# Patient Record
Sex: Male | Born: 1975 | Hispanic: No | Marital: Single | Smoking: Current every day smoker
Health system: Southern US, Community
[De-identification: ages and names within clinical notes are randomized; demographics above are authoritative.]

## PROBLEM LIST (undated history)

## (undated) DIAGNOSIS — B351 Tinea unguium: Secondary | ICD-10-CM

## (undated) HISTORY — DX: Tinea unguium: B35.1

---

## 2000-08-25 ENCOUNTER — Encounter: Admission: RE | Admit: 2000-08-25 | Discharge: 2000-08-25 | Payer: Self-pay | Admitting: Family Medicine

## 2000-08-25 ENCOUNTER — Encounter: Payer: Self-pay | Admitting: Family Medicine

## 2004-12-02 ENCOUNTER — Emergency Department (HOSPITAL_COMMUNITY): Admission: EM | Admit: 2004-12-02 | Discharge: 2004-12-02 | Payer: Self-pay | Admitting: Emergency Medicine

## 2006-08-08 IMAGING — CR DG ABDOMEN ACUTE W/ 1V CHEST
3 series · 3 of 3 positions shown · non-contrast
Comparison: No comparison films available.

CLINICAL DATA: Abdominal pain.
 THREE-VIEW ACUTE ABDOMEN SERIES WITH CHEST:

[w chest pa]
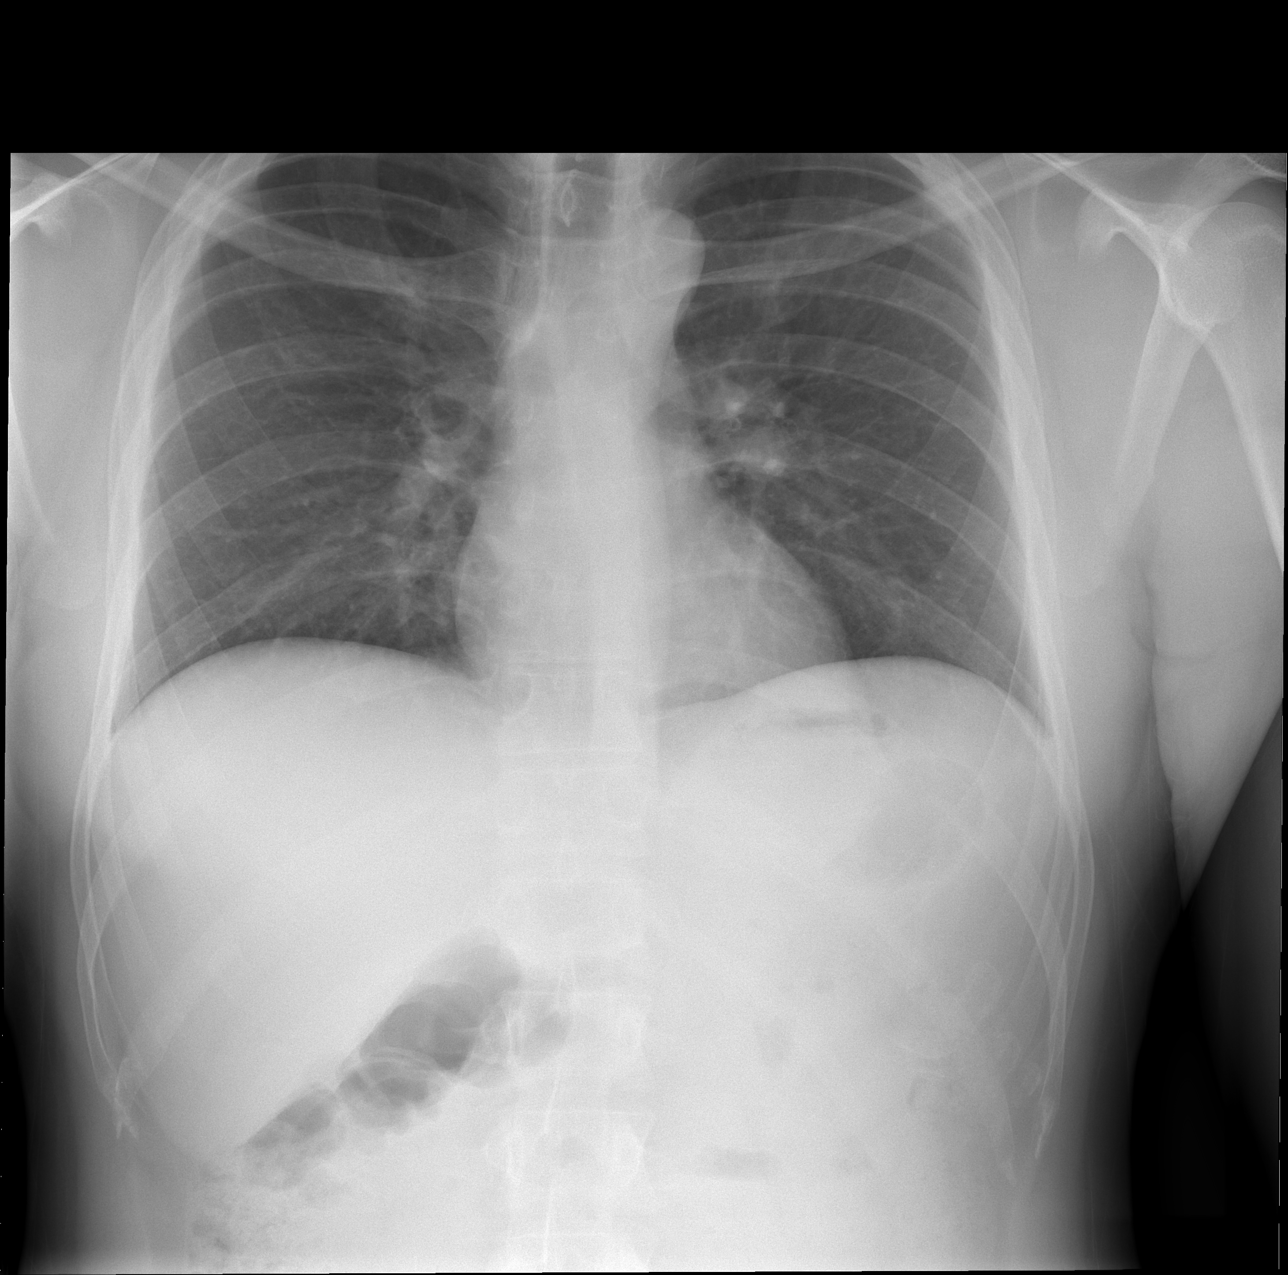

[w abdomen upright]
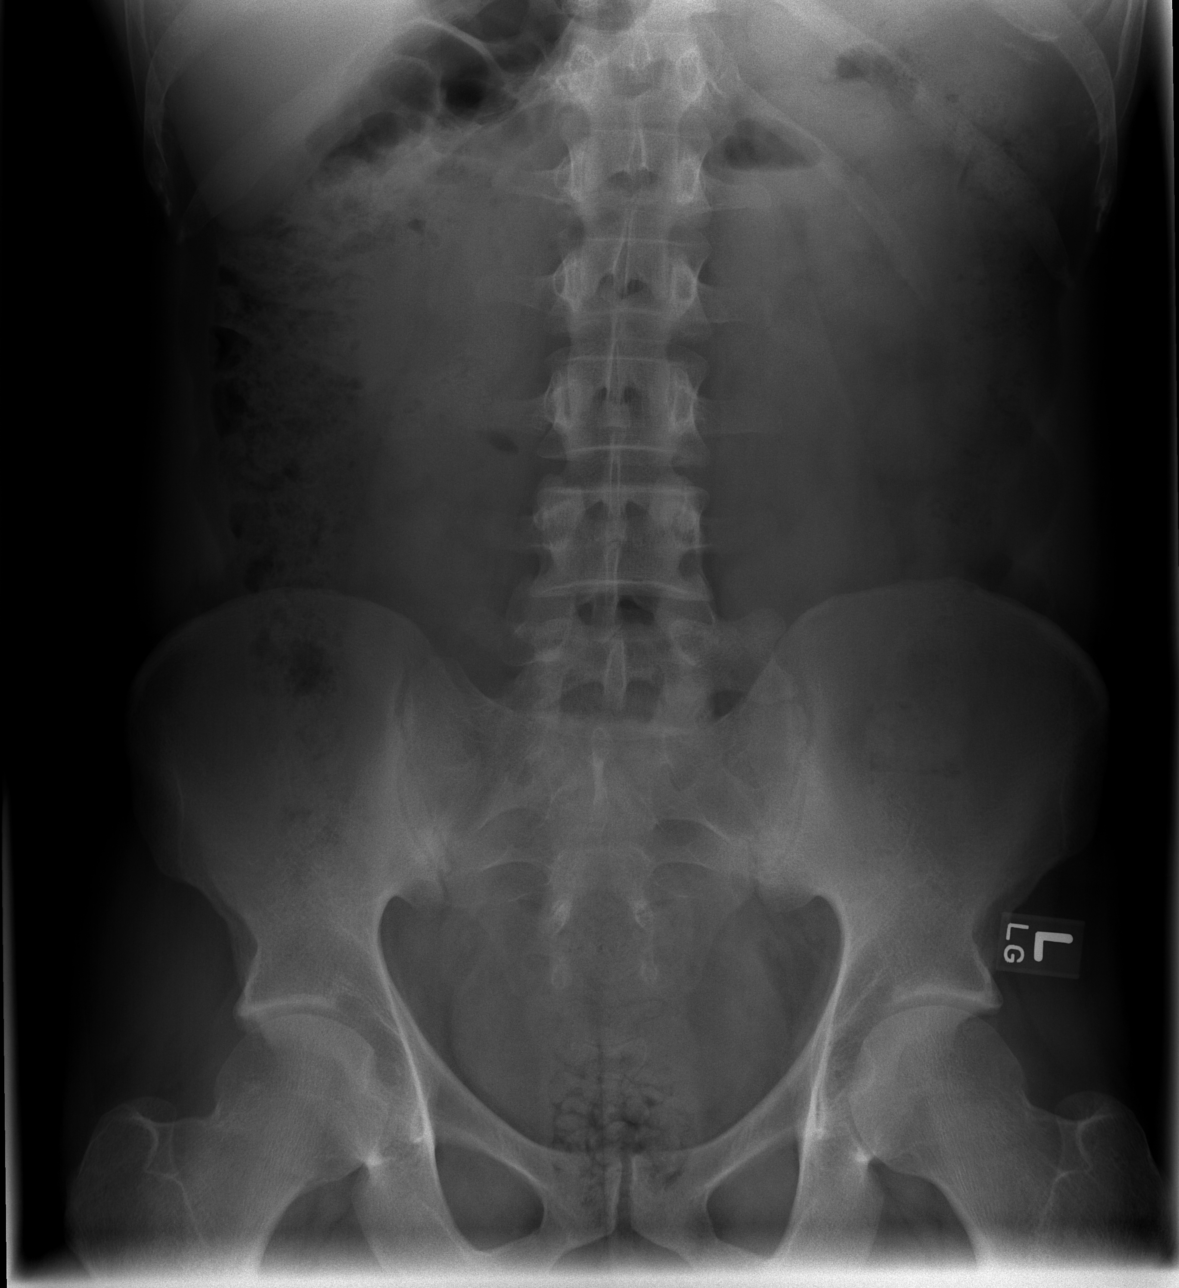

[t abdomen supine]
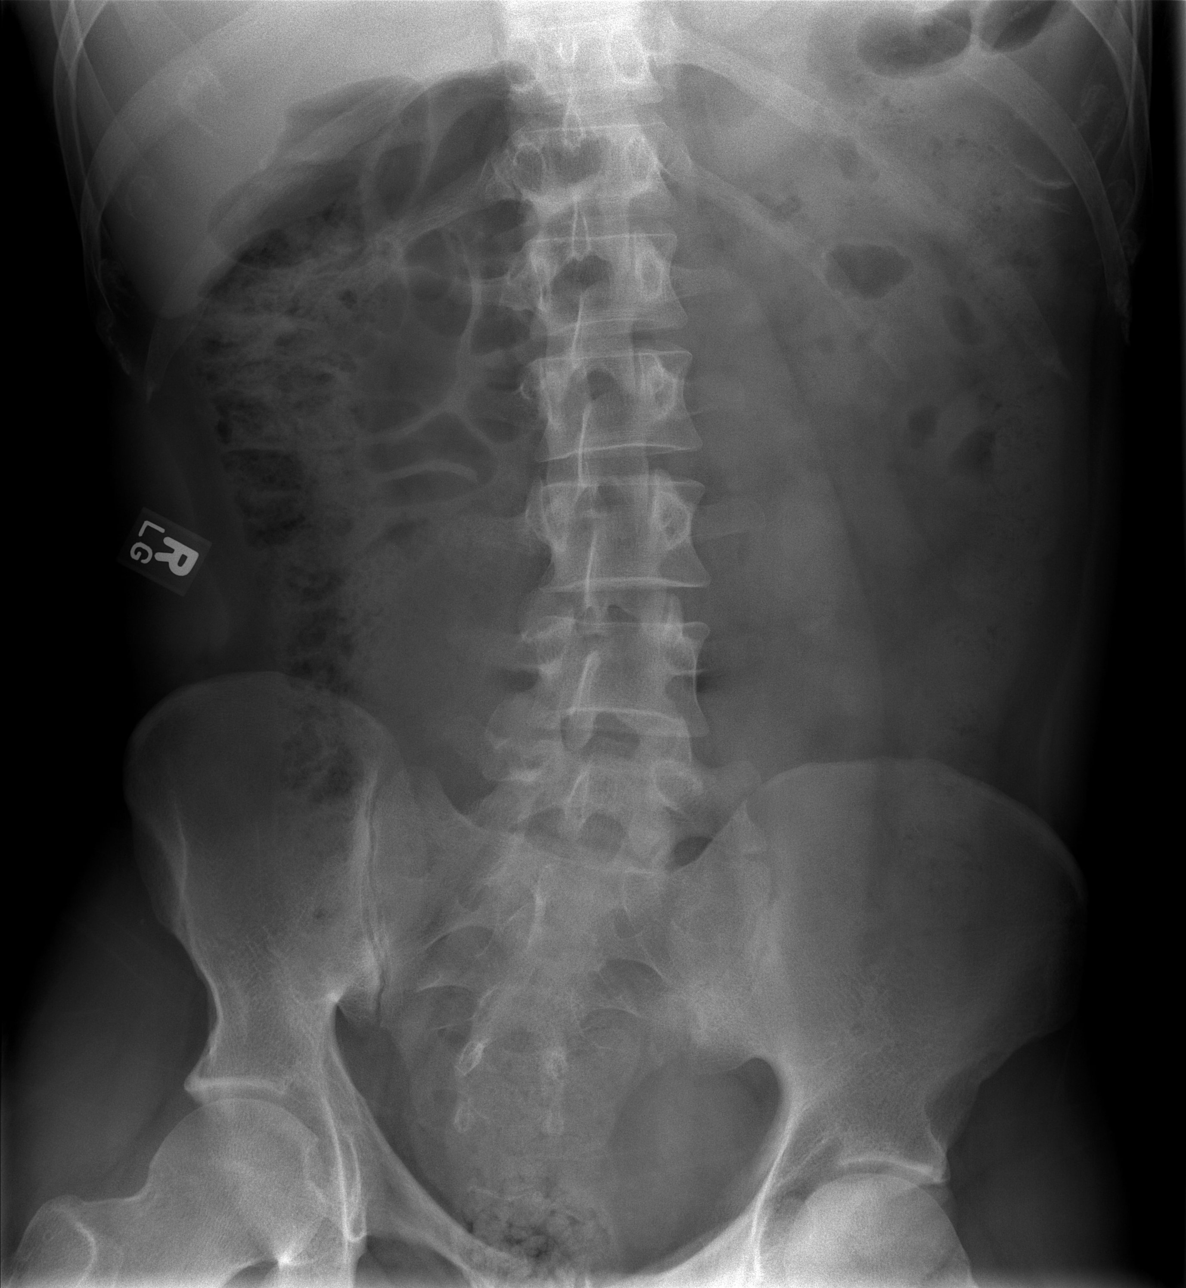

[3 of 3 positions shown; findings below may reference images not displayed]

FINDINGS: The cardiomediastinal silhouette is unremarkable.  Lungs are clear.  Bowel gas pattern is unremarkable.  No suspicious calcifications.  The bony structures are unremarkable.
IMPRESSION: No acute abnormality.

## 2007-04-21 ENCOUNTER — Ambulatory Visit: Payer: Self-pay | Admitting: Family Medicine

## 2007-04-29 ENCOUNTER — Ambulatory Visit: Payer: Self-pay | Admitting: Family Medicine

## 2008-05-30 ENCOUNTER — Ambulatory Visit: Payer: Self-pay | Admitting: Family Medicine

## 2009-03-02 ENCOUNTER — Ambulatory Visit: Payer: Self-pay | Admitting: Family Medicine

## 2009-03-06 ENCOUNTER — Ambulatory Visit: Payer: Self-pay | Admitting: Family Medicine

## 2011-02-07 ENCOUNTER — Emergency Department (HOSPITAL_COMMUNITY)
Admission: EM | Admit: 2011-02-07 | Discharge: 2011-02-07 | Disposition: A | Discharge status: Home / Self Care | Payer: No Typology Code available for payment source | Attending: Emergency Medicine | Admitting: Emergency Medicine

## 2011-02-07 DIAGNOSIS — T148XXA Other injury of unspecified body region, initial encounter: Secondary | ICD-10-CM | POA: Insufficient documentation

## 2011-02-07 DIAGNOSIS — S139XXA Sprain of joints and ligaments of unspecified parts of neck, initial encounter: Secondary | ICD-10-CM | POA: Insufficient documentation

## 2011-02-07 DIAGNOSIS — M542 Cervicalgia: Secondary | ICD-10-CM | POA: Insufficient documentation

## 2011-02-07 DIAGNOSIS — R109 Unspecified abdominal pain: Secondary | ICD-10-CM | POA: Insufficient documentation

## 2014-02-07 ENCOUNTER — Encounter (HOSPITAL_COMMUNITY): Payer: Self-pay | Admitting: Emergency Medicine

## 2014-02-07 ENCOUNTER — Emergency Department (HOSPITAL_COMMUNITY)
Admission: EM | Admit: 2014-02-07 | Discharge: 2014-02-07 | Discharge status: Against Medical Advice | Payer: Managed Care, Other (non HMO) | Attending: Emergency Medicine | Admitting: Emergency Medicine

## 2014-02-07 DIAGNOSIS — R6889 Other general symptoms and signs: Secondary | ICD-10-CM | POA: Insufficient documentation

## 2014-02-07 DIAGNOSIS — F17200 Nicotine dependence, unspecified, uncomplicated: Secondary | ICD-10-CM | POA: Insufficient documentation

## 2014-02-07 DIAGNOSIS — R062 Wheezing: Secondary | ICD-10-CM | POA: Insufficient documentation

## 2014-02-07 NOTE — ED Notes (Addendum)
According to PTAR, the patient was eating asparagus and choked on a piece of it.  The paramedics that were on the scene advised PTAR that he was wheezing.  PTAR said the patient had blown his nose and part of the Asparagus came out.  The patient says he was wheezing at first, but now he doesn't feel like anything is in his airway.  He is able to clear his throat and cough.

## 2014-02-07 NOTE — ED Notes (Signed)
Pt states he feels much better and would like to go home. Pt was encouraged to stay and be evaluated by EDP. Pt stated he can breath fine and would rather leave than wait and doesn't feel like he needs to be seen by EDP . Pt A&Ox4, respirations equal and unlabored, skin warm and dry.

## 2014-02-07 NOTE — ED Notes (Signed)
According to PTAR, the patient was eating asparagus and choked on a piece of it.  The paramedics that were on the scene advised PTAR that he was wheezing.  PTAR said the patient had blown his nose and part of the Asparagus came out.  En route, PTAR put the patient on 3L of oxygen and transported the patient here.  He was able to cough and clear his throat.

## 2014-03-14 ENCOUNTER — Ambulatory Visit (INDEPENDENT_AMBULATORY_CARE_PROVIDER_SITE_OTHER): Payer: Managed Care, Other (non HMO) | Admitting: Family Medicine

## 2014-03-14 ENCOUNTER — Encounter: Payer: Self-pay | Admitting: Family Medicine

## 2014-03-14 VITALS — BP 124/98 | Wt 212.0 lb

## 2014-03-14 DIAGNOSIS — K122 Cellulitis and abscess of mouth: Secondary | ICD-10-CM

## 2014-03-14 DIAGNOSIS — J301 Allergic rhinitis due to pollen: Secondary | ICD-10-CM

## 2014-03-14 NOTE — Progress Notes (Signed)
   Subjective:    Patient ID: Mark Diaz, male    DOB: 03-06-1976, 38 y.o.   MRN: [id number]  HPI He developed his usual allergy symptoms a few days ago and started on antihistamine. He usually has trouble in the spring and fall as well as weather changes. He woke up this morning feeling as if he had something stuck in the back of his throat which made it difficult to swallow.   Review of Systems     Objective:   Physical Exam alert and in no distress. Tympanic membranes and canals are normal. Throat shows a slightly erythematous swollen uvula. Tonsils are normal. Neck is supple without adenopathy or thyromegaly. Cardiac exam shows a regular sinus rhythm without murmurs or gallops. Lungs are clear to auscultation.        Assessment & Plan:  Allergic rhinitis due to pollen  Uvulitis  I explained that the swollen uvula is nothing to worry about but certainly disconcerting. Recommended ice chips and continue on antihistamine.

## 2014-03-14 NOTE — Patient Instructions (Signed)
Ice chips and cold drinks as well as an antihistamine are the secrets

## 2014-04-04 ENCOUNTER — Encounter: Payer: Managed Care, Other (non HMO) | Admitting: Family Medicine

## 2014-05-09 ENCOUNTER — Ambulatory Visit (INDEPENDENT_AMBULATORY_CARE_PROVIDER_SITE_OTHER): Payer: Managed Care, Other (non HMO) | Admitting: Family Medicine

## 2014-05-09 ENCOUNTER — Encounter: Payer: Self-pay | Admitting: Family Medicine

## 2014-05-09 VITALS — BP 120/80 | HR 78 | Ht 70.0 in | Wt 207.0 lb

## 2014-05-09 DIAGNOSIS — Z209 Contact with and (suspected) exposure to unspecified communicable disease: Secondary | ICD-10-CM

## 2014-05-09 DIAGNOSIS — Z23 Encounter for immunization: Secondary | ICD-10-CM

## 2014-05-09 DIAGNOSIS — J301 Allergic rhinitis due to pollen: Secondary | ICD-10-CM

## 2014-05-09 DIAGNOSIS — Z0000 Encounter for general adult medical examination without abnormal findings: Secondary | ICD-10-CM

## 2014-05-09 DIAGNOSIS — B351 Tinea unguium: Secondary | ICD-10-CM

## 2014-05-09 LAB — CBC WITH DIFFERENTIAL/PLATELET
Basophils Absolute: 0.1 10*3/uL (ref 0.0–0.1)
Basophils Relative: 1 % (ref 0–1)
EOS ABS: 0.1 10*3/uL (ref 0.0–0.7)
EOS PCT: 2 % (ref 0–5)
HCT: 39.5 % (ref 39.0–52.0)
HEMOGLOBIN: 13.8 g/dL (ref 13.0–17.0)
LYMPHS ABS: 3 10*3/uL (ref 0.7–4.0)
Lymphocytes Relative: 46 % (ref 12–46)
MCH: 31.1 pg (ref 26.0–34.0)
MCHC: 34.9 g/dL (ref 30.0–36.0)
MCV: 89 fL (ref 78.0–100.0)
MONOS PCT: 8 % (ref 3–12)
Monocytes Absolute: 0.5 10*3/uL (ref 0.1–1.0)
Neutro Abs: 2.8 10*3/uL (ref 1.7–7.7)
Neutrophils Relative %: 43 % (ref 43–77)
Platelets: 348 10*3/uL (ref 150–400)
RBC: 4.44 MIL/uL (ref 4.22–5.81)
RDW: 15.3 % (ref 11.5–15.5)
WBC: 6.6 10*3/uL (ref 4.0–10.5)

## 2014-05-09 LAB — POCT URINALYSIS DIPSTICK
Bilirubin, UA: NEGATIVE
Blood, UA: NEGATIVE
Glucose, UA: NEGATIVE
Ketones, UA: NEGATIVE
Leukocytes, UA: NEGATIVE
Nitrite, UA: NEGATIVE
Protein, UA: NEGATIVE
Spec Grav, UA: 1.02
Urobilinogen, UA: NEGATIVE
pH, UA: 5

## 2014-05-09 LAB — COMPREHENSIVE METABOLIC PANEL
ALT: 28 U/L (ref 0–53)
AST: 24 U/L (ref 0–37)
Albumin: 4.4 g/dL (ref 3.5–5.2)
Alkaline Phosphatase: 84 U/L (ref 39–117)
BILIRUBIN TOTAL: 0.4 mg/dL (ref 0.2–1.2)
BUN: 12 mg/dL (ref 6–23)
CO2: 24 meq/L (ref 19–32)
CREATININE: 1.06 mg/dL (ref 0.50–1.35)
Calcium: 9.5 mg/dL (ref 8.4–10.5)
Chloride: 106 mEq/L (ref 96–112)
Glucose, Bld: 100 mg/dL — ABNORMAL HIGH (ref 70–99)
Potassium: 4.5 mEq/L (ref 3.5–5.3)
Sodium: 138 mEq/L (ref 135–145)
TOTAL PROTEIN: 7.9 g/dL (ref 6.0–8.3)

## 2014-05-09 LAB — LIPID PANEL
Cholesterol: 212 mg/dL — ABNORMAL HIGH (ref 0–200)
HDL: 33 mg/dL — ABNORMAL LOW (ref >39)
LDL Cholesterol: 130 mg/dL — ABNORMAL HIGH (ref 0–99)
TRIGLYCERIDES: 244 mg/dL — AB (ref <150)
Total CHOL/HDL Ratio: 6.4 Ratio
VLDL: 49 mg/dL — AB (ref 0–40)

## 2014-05-09 MED ORDER — TERBINAFINE HCL 250 MG PO TABS: 250.0000 mg | ORAL_TABLET | Freq: Every day | ORAL | Status: DC

## 2014-05-09 NOTE — Progress Notes (Signed)
Subjective:    Patient ID: Mark Diaz, male    DOB: 11-10-1975, 38 y.o.   MRN: [id number]  HPI He is here for complete examination. He has enjoyed excellent health. He does have some difficulty with allergies and has tried OTC medications. He allergies are more bothersome in the spring and fall but he does have difficulties at all times as well. He also complains of thickening and mechanical dysfunction of his toenails. They're starting to deviate and cause some discomfort with walking. He has no other concerns or complaints. Family and social history were reviewed. He is getting ready to get married to his long-time partner who has been with for least 10 years. They have several children. His work is going well.   Review of Systems  All other systems reviewed and are negative.      Objective:   Physical Exam BP 120/80  Pulse 78  Ht 5' 10"  (1.778 m)  Wt 207 lb (93.895 kg)  BMI 29.70 kg/m2  General Appearance:    Alert, cooperative, no distress, appears stated age  Head:    Normocephalic, without obvious abnormality, atraumatic  Eyes:    PERRL, conjunctiva/corneas clear, EOM's intact, fundi    benign  Ears:    Normal TM's and external ear canals  Nose:   Nares normal, mucosa normal, no drainage or sinus   tenderness  Throat:   Lips, mucosa, and tongue normal; teeth and gums normal  Neck:   Supple, no lymphadenopathy;  thyroid:  no   enlargement/tenderness/nodules; no carotid   bruit or JVD  Back:    Spine nontender, no curvature, ROM normal, no CVA     tenderness  Lungs:     Clear to auscultation bilaterally without wheezes, rales or     ronchi; respirations unlabored  Chest Wall:    No tenderness or deformity   Heart:    Regular rate and rhythm, S1 and S2 normal, no murmur, rub   or gallop  Breast Exam:    No chest wall tenderness, masses or gynecomastia  Abdomen:     Soft, non-tender, nondistended, normoactive bowel sounds,    no masses, no hepatosplenomegaly    Genitalia:    Normal male external genitalia without lesions.  Testicles without masses.  No inguinal hernias.  Rectal:   Deferred due to age <40 and lack of symptoms  Extremities:   No clubbing, cyanosis or edema toenails on both sides are thick, dark and some of them deviate.   Pulses:   2+ and symmetric all extremities  Skin:   Skin color, texture, turgor normal, no rashes or lesions  Lymph nodes:   Cervical, supraclavicular, and axillary nodes normal  Neurologic:   CNII-XII intact, normal strength, sensation and gait; reflexes 2+ and symmetric throughout          Psych:   Normal mood, affect, hygiene and grooming.          Assessment & Plan:  Routine general medical examination at a health care facility - Plan: CBC with Differential, Comprehensive metabolic panel, Lipid panel  Allergic rhinitis due to pollen - Plan: POCT Urinalysis Dipstick  Need for prophylactic vaccination and inoculation against influenza - Plan: Flu Vaccine QUAD 36+ mos IM  Onychomycosis - Plan: terbinafine (LAMISIL) 250 MG tablet  Need for Tdap vaccination - Plan: Tdap vaccine greater than or equal to 7yo IM  Contact with or exposure to unspecified communicable disease - Plan: HIV antibody, RPR  recommend he use an  over-the-counter steroid preparation and antihistamine if needed. Discussed proper use of this. Will also place him on Lamisil. Discussed possible side effects and benefits. He is to return here in 3 months for recheck and blood work. Flu shot given. HIV and RPR was done at patient request

## 2014-05-09 NOTE — Patient Instructions (Signed)
For your allergies try Flonase or Nasacort. You can also use an antihistamine like Claritin or Allegra

## 2014-05-10 LAB — HIV ANTIBODY (ROUTINE TESTING W REFLEX): HIV 1&2 Ab, 4th Generation: NONREACTIVE

## 2014-05-10 LAB — RPR

## 2014-08-09 ENCOUNTER — Ambulatory Visit: Payer: Managed Care, Other (non HMO) | Admitting: Family Medicine

## 2014-08-16 ENCOUNTER — Encounter: Payer: Self-pay | Admitting: Family Medicine

## 2014-08-16 ENCOUNTER — Ambulatory Visit (INDEPENDENT_AMBULATORY_CARE_PROVIDER_SITE_OTHER): Payer: Managed Care, Other (non HMO) | Admitting: Family Medicine

## 2014-08-16 VITALS — BP 122/84 | HR 88 | Wt 207.0 lb

## 2014-08-16 DIAGNOSIS — Z79899 Other long term (current) drug therapy: Secondary | ICD-10-CM

## 2014-08-16 DIAGNOSIS — B351 Tinea unguium: Secondary | ICD-10-CM

## 2014-08-16 DIAGNOSIS — S63601A Unspecified sprain of right thumb, initial encounter: Secondary | ICD-10-CM

## 2014-08-16 NOTE — Patient Instructions (Signed)
If your thumb is still bothering you in several weeks, make another appointment for follow-up

## 2014-08-16 NOTE — Progress Notes (Signed)
   Subjective:    Patient ID: Mark Diaz, male    DOB: Nov 23, 1975, 38 y.o.   MRN: [id number]  HPI He is here for recheck on his use of Lamisil. He has noted no difficulties from the medication and has noted slight improvement in his toenails. He also complains of right thumb discomfort. There is a questionable history of injury one month ago however he says he noted the difficulty with his thumb approximately 2 weeks ago.   Review of Systems     Objective:   Physical Exam Full motion of the right thumb with slight discomfort to palpation over the ulnar collateral ligament. The ligament is slightly lax. Full flexion and extension.       Assessment & Plan:  Onychomycosis - Plan: Comprehensive metabolic panel  Thumb sprain, right, initial encounter  Encounter for long-term (current) use of medications - Plan: Comprehensive metabolic panel  I will treat his thumb conservatively at this point but he continues to have difficulty, he will call me in 2 weeks for probable x-ray and referral

## 2014-08-17 LAB — COMPREHENSIVE METABOLIC PANEL
ALBUMIN: 4.1 g/dL (ref 3.5–5.2)
ALT: 24 U/L (ref 0–53)
AST: 25 U/L (ref 0–37)
Alkaline Phosphatase: 82 U/L (ref 39–117)
BILIRUBIN TOTAL: 0.3 mg/dL (ref 0.2–1.2)
BUN: 13 mg/dL (ref 6–23)
CO2: 22 mEq/L (ref 19–32)
Calcium: 9.1 mg/dL (ref 8.4–10.5)
Chloride: 106 mEq/L (ref 96–112)
Creat: 1.02 mg/dL (ref 0.50–1.35)
Glucose, Bld: 109 mg/dL — ABNORMAL HIGH (ref 70–99)
Potassium: 4.1 mEq/L (ref 3.5–5.3)
Sodium: 137 mEq/L (ref 135–145)
Total Protein: 7.4 g/dL (ref 6.0–8.3)

## 2014-08-19 MED ORDER — TERBINAFINE HCL 250 MG PO TABS: 250.0000 mg | ORAL_TABLET | Freq: Every day | ORAL | Status: DC

## 2014-08-19 NOTE — Addendum Note (Signed)
Addended by: Denita Lung on: 08/19/2014 07:41 AM   Modules accepted: Orders

## 2015-10-23 ENCOUNTER — Ambulatory Visit (INDEPENDENT_AMBULATORY_CARE_PROVIDER_SITE_OTHER): Payer: Managed Care, Other (non HMO) | Admitting: Family Medicine

## 2015-10-23 ENCOUNTER — Encounter: Payer: Self-pay | Admitting: Family Medicine

## 2015-10-23 VITALS — BP 116/76 | HR 92 | Ht 70.0 in | Wt 211.0 lb

## 2015-10-23 DIAGNOSIS — J301 Allergic rhinitis due to pollen: Secondary | ICD-10-CM | POA: Diagnosis not present

## 2015-10-23 DIAGNOSIS — B351 Tinea unguium: Secondary | ICD-10-CM

## 2015-10-23 DIAGNOSIS — Z0000 Encounter for general adult medical examination without abnormal findings: Secondary | ICD-10-CM

## 2015-10-23 DIAGNOSIS — Z23 Encounter for immunization: Secondary | ICD-10-CM | POA: Diagnosis not present

## 2015-10-23 DIAGNOSIS — Z79899 Other long term (current) drug therapy: Secondary | ICD-10-CM | POA: Diagnosis not present

## 2015-10-23 LAB — COMPREHENSIVE METABOLIC PANEL
ALT: 48 U/L — ABNORMAL HIGH (ref 9–46)
AST: 33 U/L (ref 10–40)
Albumin: 4.2 g/dL (ref 3.6–5.1)
Alkaline Phosphatase: 83 U/L (ref 40–115)
BUN: 14 mg/dL (ref 7–25)
CHLORIDE: 105 mmol/L (ref 98–110)
CO2: 26 mmol/L (ref 20–31)
Calcium: 9.5 mg/dL (ref 8.6–10.3)
Creat: 1.22 mg/dL (ref 0.60–1.35)
Glucose, Bld: 81 mg/dL (ref 65–99)
POTASSIUM: 4.3 mmol/L (ref 3.5–5.3)
Sodium: 136 mmol/L (ref 135–146)
Total Bilirubin: 0.4 mg/dL (ref 0.2–1.2)
Total Protein: 7.7 g/dL (ref 6.1–8.1)

## 2015-10-23 LAB — LIPID PANEL
CHOL/HDL RATIO: 6.9 ratio — AB (ref <=5.0)
Cholesterol: 193 mg/dL (ref 125–200)
HDL: 28 mg/dL — AB (ref >=40)
LDL CALC: 105 mg/dL (ref <130)
Triglycerides: 299 mg/dL — ABNORMAL HIGH (ref <150)
VLDL: 60 mg/dL — AB (ref <30)

## 2015-10-23 LAB — CBC WITH DIFFERENTIAL/PLATELET
Basophils Absolute: 0 10*3/uL (ref 0.0–0.1)
Basophils Relative: 0 % (ref 0–1)
EOS ABS: 0.2 10*3/uL (ref 0.0–0.7)
EOS PCT: 4 % (ref 0–5)
HEMATOCRIT: 41.6 % (ref 39.0–52.0)
Hemoglobin: 14 g/dL (ref 13.0–17.0)
LYMPHS ABS: 3.4 10*3/uL (ref 0.7–4.0)
LYMPHS PCT: 55 % — AB (ref 12–46)
MCH: 30.9 pg (ref 26.0–34.0)
MCHC: 33.7 g/dL (ref 30.0–36.0)
MCV: 91.8 fL (ref 78.0–100.0)
MONOS PCT: 6 % (ref 3–12)
MPV: 8.9 fL (ref 8.6–12.4)
Monocytes Absolute: 0.4 10*3/uL (ref 0.1–1.0)
NEUTROS PCT: 35 % — AB (ref 43–77)
Neutro Abs: 2.2 10*3/uL (ref 1.7–7.7)
Platelets: 335 10*3/uL (ref 150–400)
RBC: 4.53 MIL/uL (ref 4.22–5.81)
RDW: 14.3 % (ref 11.5–15.5)
WBC: 6.2 10*3/uL (ref 4.0–10.5)

## 2015-10-23 MED ORDER — FLUTICASONE PROPIONATE 50 MCG/ACT NA SUSP: 2.0000 | Freq: Every day | NASAL | Status: DC

## 2015-10-23 MED ORDER — TERBINAFINE HCL 250 MG PO TABS: 250.0000 mg | ORAL_TABLET | Freq: Every day | ORAL | Status: DC

## 2015-10-23 MED ORDER — LORATADINE 10 MG PO TABS: 10.0000 mg | ORAL_TABLET | Freq: Every day | ORAL | Status: DC

## 2015-10-23 NOTE — Patient Instructions (Addendum)
Use Flonase and Claritin .Uses medicine for 3 or 4 weeks and then if you're not any better call me 150 minutes a week of something physical Cut Back on bread, rice pasta,Potatoes and sugar

## 2015-10-23 NOTE — Progress Notes (Signed)
Subjective:    Patient ID: Mark Diaz, male    DOB: 04-Apr-1976, 40 y.o.   MRN: [id number]  HPI He is here for complete examination. He was treated for onychomycosis with Lamisil however only got a three-month supply. He was thinking that was all that was needed. Since then he has noted slight worsening. He also has underlying allergies with rhinorrhea, congestion, sneezing, snoring. This bothers him mainly in the fall and winter. When quizzed about OSA symptoms, he is really not having any daytime sleepiness or other symptoms.He has use Benadryl on occasion for this. Otherwise he has no particular concerns or complaints. No cough, congestion, GI or GU symptoms. Family and social history as well as health maintenance and immunizations were reviewed. He is married with 3 children. His work and home life are going quite well.   Review of Systems  All other systems reviewed and are negative.      Objective:   Physical Exam BP 116/76 mmHg  Pulse 92  Ht 5' 10"  (1.778 m)  Wt 211 lb (95.709 kg)  BMI 30.28 kg/m2  SpO2 97%  General Appearance:    Alert, cooperative, no distress, appears stated age  Head:    Normocephalic, without obvious abnormality, atraumatic  Eyes:    PERRL, conjunctiva/corneas clear, EOM's intact, fundi    benign  Ears:    Normal TM's and external ear canals  Nose:   Nares normal, mucosa normal, no drainage or sinus   tenderness  Throat:   Lips, mucosa, and tongue normal; teeth and gums normal  Neck:   Supple, no lymphadenopathy;  thyroid:  no   enlargement/tenderness/nodules; no carotid   bruit or JVD  Back:    Spine nontender, no curvature, ROM normal, no CVA     tenderness  Lungs:     Clear to auscultation bilaterally without wheezes, rales or     ronchi; respirations unlabored  Chest Wall:    No tenderness or deformity   Heart:    Regular rate and rhythm, S1 and S2 normal, no murmur, rub   or gallop  Breast Exam:    No chest wall tenderness, masses or  gynecomastia  Abdomen:     Soft, non-tender, nondistended, normoactive bowel sounds,    no masses, no hepatosplenomegaly  Genitalia:  deferred  Rectal:  deferred  Extremities:   No clubbing, cyanosis or edema. Lower extremity nails are thickened and discolored.  Pulses:   2+ and symmetric all extremities  Skin:   Skin color, texture, turgor normal, no rashes or lesions  Lymph nodes:   Cervical, supraclavicular, and axillary nodes normal  Neurologic:   CNII-XII intact, normal strength, sensation and gait; reflexes 2+ and symmetric throughout          Psych:   Normal mood, affect, hygiene and grooming.          Assessment & Plan:  Routine general medical examination at a health care facility - Plan: CBC with Differential/Platelet, Comprehensive metabolic panel, Lipid panel  Onychomycosis - Plan: terbinafine (LAMISIL) 250 MG tablet  Encounter for long-term (current) use of medications - Plan: CBC with Differential/Platelet, Comprehensive metabolic panel, Lipid panel  Allergic rhinitis due to pollen, unspecified rhinitis seasonality - Plan: loratadine (CLARITIN) 10 MG tablet, fluticasone (FLONASE) 50 MCG/ACT nasal spray  Need for prophylactic vaccination and inoculation against influenza - Plan: Flu Vaccine QUAD 36+ mos IM He will return here in 3 months. He is also to use Claritin as well as Flonase regularly.  He is to call me in approximately one month to let me know how he is doing.

## 2016-01-21 ENCOUNTER — Other Ambulatory Visit: Payer: Self-pay | Admitting: Family Medicine

## 2016-01-21 ENCOUNTER — Ambulatory Visit (INDEPENDENT_AMBULATORY_CARE_PROVIDER_SITE_OTHER): Payer: Managed Care, Other (non HMO) | Admitting: Family Medicine

## 2016-01-21 ENCOUNTER — Encounter: Payer: Self-pay | Admitting: Family Medicine

## 2016-01-21 VITALS — BP 142/88 | HR 84 | Ht 70.0 in | Wt 215.8 lb

## 2016-01-21 DIAGNOSIS — B351 Tinea unguium: Secondary | ICD-10-CM

## 2016-01-21 DIAGNOSIS — Z79899 Other long term (current) drug therapy: Secondary | ICD-10-CM | POA: Diagnosis not present

## 2016-01-21 DIAGNOSIS — J301 Allergic rhinitis due to pollen: Secondary | ICD-10-CM | POA: Insufficient documentation

## 2016-01-21 LAB — COMPREHENSIVE METABOLIC PANEL
ALBUMIN: 4.4 g/dL (ref 3.6–5.1)
ALT: 121 U/L — ABNORMAL HIGH (ref 9–46)
AST: 67 U/L — AB (ref 10–40)
Alkaline Phosphatase: 91 U/L (ref 40–115)
BILIRUBIN TOTAL: 0.4 mg/dL (ref 0.2–1.2)
BUN: 9 mg/dL (ref 7–25)
CHLORIDE: 103 mmol/L (ref 98–110)
CO2: 23 mmol/L (ref 20–31)
CREATININE: 1.16 mg/dL (ref 0.60–1.35)
Calcium: 9.4 mg/dL (ref 8.6–10.3)
Glucose, Bld: 108 mg/dL — ABNORMAL HIGH (ref 65–99)
Potassium: 4.3 mmol/L (ref 3.5–5.3)
SODIUM: 137 mmol/L (ref 135–146)
TOTAL PROTEIN: 7.5 g/dL (ref 6.1–8.1)

## 2016-01-21 MED ORDER — FLUTICASONE PROPIONATE 50 MCG/ACT NA SUSP: 2.0000 | Freq: Every day | NASAL | Status: DC

## 2016-01-21 MED ORDER — TERBINAFINE HCL 250 MG PO TABS: 250.0000 mg | ORAL_TABLET | Freq: Every day | ORAL | Status: AC

## 2016-01-21 MED ORDER — FEXOFENADINE HCL 180 MG PO TABS: 180.0000 mg | ORAL_TABLET | Freq: Every day | ORAL | Status: DC

## 2016-01-21 NOTE — Progress Notes (Signed)
   Subjective:    Patient ID: Mark Diaz, male    DOB: Jul 19, 1976, 40 y.o.   MRN: [id number]  HPI He is here for a recheck.Continues on Lamisil and has had no difficulty with that. He also has underlying allergies having difficulty with sneezing, itchy watery eyes, nasal congestion and rhinorrhea as well as some postnasal drainage.He has not been  using Flonase appropriately.  Review of Systems     Objective:   Physical Exam Alert and in no distress. Exam of toenails does show interval improvement of the nail with clearing proximally of about a half centimeter       Assessment & Plan:  Onychomycosis - Plan: terbinafine (LAMISIL) 250 MG tablet, Comprehensive metabolic panel  Encounter for long-term (current) use of medications - Plan: Comprehensive metabolic panel  Allergic rhinitis due to pollen, unspecified rhinitis seasonality - Plan: fexofenadine (ALLEGRA ALLERGY) 180 MG tablet, fluticasone (FLONASE) 50 MCG/ACT nasal spray He will continue on his Lamisil. I will check his liver enzymes. We'll also switch him to [place] and demonstrated proper use of Flonase.He will call me in 3 weeks if continued difficulty.

## 2016-01-22 LAB — HEPATITIS C ANTIBODY: HCV Ab: NEGATIVE

## 2016-01-22 LAB — HEPATITIS B SURFACE ANTIBODY, QUANTITATIVE: Hepatitis B-Post: 414 m[IU]/mL

## 2016-12-30 ENCOUNTER — Ambulatory Visit (INDEPENDENT_AMBULATORY_CARE_PROVIDER_SITE_OTHER): Payer: Managed Care, Other (non HMO) | Admitting: Family Medicine

## 2016-12-30 ENCOUNTER — Encounter: Payer: Self-pay | Admitting: Family Medicine

## 2016-12-30 VITALS — BP 120/80 | HR 80 | Temp 98.0°F | Wt 216.0 lb

## 2016-12-30 DIAGNOSIS — J301 Allergic rhinitis due to pollen: Secondary | ICD-10-CM | POA: Diagnosis not present

## 2016-12-30 MED ORDER — LEVOCETIRIZINE DIHYDROCHLORIDE 5 MG PO TABS: 5.0000 mg | ORAL_TABLET | Freq: Every evening | ORAL | 11 refills | Status: DC

## 2016-12-30 MED ORDER — MONTELUKAST SODIUM 10 MG PO TABS
10.0000 mg | ORAL_TABLET | Freq: Every day | ORAL | 3 refills | Status: DC
Start: 2016-12-30 — End: 2021-03-07

## 2016-12-30 NOTE — Progress Notes (Addendum)
Subjective:     Patient ID: Mark Diaz, male   DOB: Jul 23, 1976, 41 y.o.   MRN: [id number]  HPI Sem Mccaughey is a 41 year old male with a past medical history of onychomycosis and allergies who presents today with concerns regarding his allergies.  His allergies have been ongoing for the past 4-5 years. It is associated with drainage, itchy throat, congestion around his nose and cheeks, burning/itchy/red/puffy eyes, and runny nose. It is also associated with increased snoring. He notes it is worse when the seasons change and is associated with more pollen. He notes the symptoms aren't as bad when he visits other locations. He reports no ear pain, headaches, fever, chills, nausea, vomiting, diarrhea, constipation. 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 He has previously tried Human resources officer, claritin, and zyrtec, but feels like they work for the first week then stops working. He prefers not to use the flonase because he doesn't like nasal sprays. He has used equate sinus and allergy, but it wears off early and isn't very effective either.   He has no known allergies to medications and is not on any current medications. He reports that he quit smoking yesterday, but used to smoke half a pack a day. He drinks 4-5 drinks on the weekends. He does not use drugs.   Review of Systems Pertinent positives and negatives included in the subjective.    Objective:   Physical Exam Ears were clear and TMs normal bilaterally. Nasal mucosa was non-erythematous. Throat was non-erythematous and without exudate. There was no neck lymphadenopathy or tenderness. Heart was regular rate and rhythm without murmurs, rubs, or gallops. Lungs were clear to auscultation bilaterally.    Assessment and Plan:     Acute seasonal allergic rhinitis due to pollen  1. Allergies: We prescribed xyzal and singulair to see if this helps manage his allergies. We also encouraged use of the flonase  even though the patient doesn't prefer the sprays..  2. Onychomycosis: Patient says this has been well controlled and doesn't have questions or concerns. 3. Tobacco Use: Patient says he recently stopped smoking cigarettes. We congratulated and encouraged him. He will call me if he has further difficulties. At this point he is not wanting to use the Flonase however the regimen of Xyzal and singular doesn't work, will recommend that he do get back on that. We encouraged patient to schedule his annual physical.  Patient was seen in conjunction with Dr. Redmond School. [place], MS3

## 2016-12-30 NOTE — Patient Instructions (Signed)
Use the 2 medicines and if they work we don't need to do anymore but if not then I went to to use Flonase as well call family problems.

## 2018-01-06 ENCOUNTER — Ambulatory Visit: Payer: Managed Care, Other (non HMO) | Admitting: Family Medicine

## 2018-01-06 ENCOUNTER — Encounter: Payer: Self-pay | Admitting: Family Medicine

## 2018-01-06 VITALS — BP 128/88 | HR 88 | Temp 97.7°F | Ht 70.5 in | Wt 209.4 lb

## 2018-01-06 DIAGNOSIS — L600 Ingrowing nail: Secondary | ICD-10-CM

## 2018-01-06 DIAGNOSIS — L309 Dermatitis, unspecified: Secondary | ICD-10-CM | POA: Diagnosis not present

## 2018-01-06 MED ORDER — TRIAMCINOLONE ACETONIDE 0.1 % EX CREA: TOPICAL_CREAM | CUTANEOUS | 0 refills | Status: AC

## 2018-01-06 MED ORDER — CEPHALEXIN 500 MG PO CAPS: 500.0000 mg | ORAL_CAPSULE | Freq: Three times a day (TID) | ORAL | 0 refills | Status: DC

## 2018-01-06 NOTE — Progress Notes (Signed)
Chief Complaint  Patient presents with  . Acute Visit    left big toe into foot, x1 day, swollen    Last night he noticed some discomfort around the medial aspect of the great toenail.  This morning the pain spread up the toe, and noticed swelling. Not exquisitely sensitive to touch (able to wear a shoe), but hurts to walk and put pressure on it. He wears steel-toed shoes at work.  Gets pedicures, none in the last few months. Admits that his toenails are long and need to be cut. He has h/o onychomycosis, s/p lamisil treatment, never completely resolved, never followed up.  He also reports ongoing rash at his ankles, and some scattered papules on arms/legs that he relates to possible spider/insect bites last summer.  They intermittently get itchy, and rash never completely resolved.   PMH, PSH, SH reviewed  Meds:  None currently (ran out) No Known Allergies  ROS: no fever, chills, nausea, vomiting. No other joint pains. No headaches, dizziness, chest pain.  Some seasonal allergies  PHYSICAL EXAM:  BP 128/88 (BP Location: Right Arm, Patient Position: Sitting, Cuff Size: Normal)   Pulse 88   Temp 97.7 F (36.5 C) (Oral)   Ht 5' 10.5" (1.791 m)   Wt 209 lb 6.4 oz (95 kg)   SpO2 99%   BMI 29.62 kg/m   Pleasant, well-appearing male, in no distress Extremities: 2+ pulses. Thickened, curved, discolored great toenails bilaterally Left great toe: Tender at medial aspect, extending proximally rom the edge of the nail. +ingrowing nail. Slightly warm, no erythema, only slight STS (not tight). No fluctuance, no drainage. Slight STS into the toe, not at joint  Ankles ant/post-- hyperpigmented papules and thickening of skin. Some scattered hyperpigmented/dark papules further up on leg  ASSESSMENT/PLAN:  Ingrowing toenail of left foot - soaks, ABX. instructed how to properly cut nails. no e/o abscess. f/u next week if worsening - Plan: cephALEXin (KEFLEX) 500 MG capsule  Dermatitis -  suspect related to insect bites (?fire ants) with persistent itchy rash. Trial of steroid cream x up to 2wks - Plan: triamcinolone cream (KENALOG) 0.1 %  Hasn't been seen by Dr. Redmond School in a year, encouraged him to make appt for CPE.   Ingrowing left great toenail  Soak your toe at least 3 times daily (soapy water or epsom salts). This will soften the nail, and when it is softer you can try and get the ingrowing nail edge out of the skin, and up and over top of the skin. When it is softer, trim the nails straight across (do NOT cut the corner off). Take the antibiotic as directed. If toe is throbbing, keep it elevated You may use tylenol and/or ibuprofen as needed for pain. Return next week to see Dr. Redmond School if not improving.    Insect bites/rash  TAC 0.1% cream--apply cream sparingly to the affected area of skin only, twice daily for no longer than 2 weeks.

## 2018-01-06 NOTE — Patient Instructions (Signed)
Ingrowing left great toenail  Soak your toe at least 3 times daily (soapy water or epsom salts). This will soften the nail, and when it is softer you can try and get the ingrowing nail edge out of the skin, and up and over top of the skin. When it is softer, trim the nails straight across (do NOT cut the corner off). Take the antibiotic as directed. If toe is throbbing, keep it elevated You may use tylenol and/or ibuprofen as needed for pain. Return next week to see Dr. Redmond School if not improving.    Insect bites/rash  TAC 0.1% cream--apply cream sparingly to the affected area of skin only, twice daily for no longer than 2 weeks.  Ingrown Toenail An ingrown toenail occurs when the corner or sides of your toenail grow into the surrounding skin. The big toe is most commonly affected, but it can happen to any of your toes. If your ingrown toenail is not treated, you will be at risk for infection. What are the causes? This condition may be caused by:  Wearing shoes that are too small or tight.  Injury or trauma, such as stubbing your toe or having your toe stepped on.  Improper cutting or care of your toenails.  Being born with (congenital) nail or foot abnormalities, such as having a nail that is too big for your toe.  What increases the risk? Risk factors for an ingrown toenail include:  Age. Your nails tend to thicken as you get older, so ingrown nails are more common in older people.  Diabetes.  Cutting your toenails incorrectly.  Blood circulation problems.  What are the signs or symptoms? Symptoms may include:  Pain, soreness, or tenderness.  Redness.  Swelling.  Hardening of the skin surrounding the toe.  Your ingrown toenail may be infected if there is fluid, pus, or drainage. How is this diagnosed? An ingrown toenail may be diagnosed by medical history and physical exam. If your toenail is infected, your health care provider may test a sample of the  drainage. How is this treated? Treatment depends on the severity of your ingrown toenail. Some ingrown toenails may be treated at home. More severe or infected ingrown toenails may require surgery to remove all or part of the nail. Infected ingrown toenails may also be treated with antibiotic medicines. Follow these instructions at home:  If you were prescribed an antibiotic medicine, finish all of it even if you start to feel better.  Soak your foot in warm soapy water for 20 minutes, 3 times per day or as directed by your health care provider.  Carefully lift the edge of the nail away from the sore skin by wedging a small piece of cotton under the corner of the nail. This may help with the pain. Be careful not to cause more injury to the area.  Wear shoes that fit well. If your ingrown toenail is causing you pain, try wearing sandals, if possible.  Trim your toenails regularly and carefully. Do not cut them in a curved shape. Cut your toenails straight across. This prevents injury to the skin at the corners of the toenail.  Keep your feet clean and dry.  If you are having trouble walking and are given crutches by your health care provider, use them as directed.  Do not pick at your toenail or try to remove it yourself.  Take medicines only as directed by your health care provider.  Keep all follow-up visits as directed by your  health care provider. This is important. Contact a health care provider if:  Your symptoms do not improve with treatment. Get help right away if:  You have red streaks that start at your foot and go up your leg.  You have a fever.  You have increased redness, swelling, or pain.  You have fluid, blood, or pus coming from your toenail. This information is not intended to replace advice given to you by your health care provider. Make sure you discuss any questions you have with your health care provider. Document Released: 09/05/2000 Document Revised:  02/08/2016 Document Reviewed: 08/02/2014 Elsevier Interactive Patient Education  [employer].

## 2019-04-18 ENCOUNTER — Other Ambulatory Visit: Payer: Self-pay

## 2019-04-18 ENCOUNTER — Encounter: Payer: Self-pay | Admitting: Family Medicine

## 2019-04-18 ENCOUNTER — Ambulatory Visit: Payer: 59 | Admitting: Family Medicine

## 2019-04-18 VITALS — BP 120/84 | HR 86 | Temp 98.1°F | Wt 210.4 lb

## 2019-04-18 DIAGNOSIS — L309 Dermatitis, unspecified: Secondary | ICD-10-CM

## 2019-04-18 DIAGNOSIS — B070 Plantar wart: Secondary | ICD-10-CM | POA: Diagnosis not present

## 2019-04-18 NOTE — Progress Notes (Signed)
   Subjective:    Patient ID: Mark Diaz, male    DOB: 07-Sep-1976, 43 y.o.   MRN: [id number]  HPI He is here for evaluation of a painful lesion on the ball of the right foot.  He also continues to have difficulty with a hyperpigmented rash bilaterally on the ankles just above his sock line.  He has tried steroids without success.   Review of Systems     Objective:   Physical Exam Plantar wart noted on the ball the foot just proximal to the third metatarsal.  Hyperpigmented lesions are noted in a linear pattern above the sock line.      Assessment & Plan:  Plantar wart - Plan: Ambulatory referral to Dermatology,   Dermatitis - Plan: Ambulatory referral to Dermatology,  I did shave down the plantar wart and recommend he use Compound W on it daily for several days then shave it off.  He is to do this on a weekly cycle.  We will then schedule him to see dermatology to have the dermatitis looked at and also potentially treat the plantar wart.  Explained that there are multiple ways to treat the wart.

## 2019-04-26 ENCOUNTER — Telehealth: Payer: Self-pay

## 2019-04-26 NOTE — Telephone Encounter (Signed)
LVM of appt 05-03-19 with dr Renard Hamper at [facility] dermatology. [place]

## 2019-05-03 ENCOUNTER — Ambulatory Visit: Payer: 59 | Admitting: Family Medicine

## 2019-05-03 ENCOUNTER — Encounter: Payer: Self-pay | Admitting: Family Medicine

## 2019-05-03 ENCOUNTER — Other Ambulatory Visit: Payer: Self-pay

## 2019-05-03 ENCOUNTER — Telehealth: Payer: Self-pay

## 2019-05-03 VITALS — BP 120/82 | HR 93 | Temp 98.2°F | Wt 209.8 lb

## 2019-05-03 DIAGNOSIS — B070 Plantar wart: Secondary | ICD-10-CM

## 2019-05-03 NOTE — Progress Notes (Signed)
   Subjective:    Patient ID: Mark Diaz, male    DOB: Jul 15, 1976, 43 y.o.   MRN: [id number]  HPI He was recently seen by dermatology and had a procedure done on the skin lesion and also had a lesion on the plantar surface of his foot worked on.  He states he has had difficulty with the discomfort and would like a note for work.   Review of Systems     Objective:   Physical Exam Alert and in no distress.  Recent sutures are seen on the posterior aspect of his foot.  Also the plantar surface of his foot does show recent surgical intervention.       Assessment & Plan:   Encounter Diagnosis  Name Primary?  . Plantar wart Yes  Although he was told it was a corn, I feel that it is more likely a wart.  Recommended he use a Dr. Felicie Morn doughnut pad on this area to take the pressure off and if he still has difficulty, call dermatology for a note for work.

## 2019-05-03 NOTE — Telephone Encounter (Signed)
Patient states that he went to [facility].  He wants a work note to be off the rest of the week from you.   I told him that he would need to ask Lyndle Herrlich but he states they said to ask you his PCP.

## 2019-09-30 ENCOUNTER — Ambulatory Visit: Payer: 59 | Attending: Internal Medicine

## 2019-09-30 DIAGNOSIS — Z20822 Contact with and (suspected) exposure to covid-19: Secondary | ICD-10-CM

## 2019-10-02 LAB — NOVEL CORONAVIRUS, NAA: SARS-CoV-2, NAA: NOT DETECTED

## 2019-10-07 ENCOUNTER — Ambulatory Visit: Payer: 59 | Attending: Internal Medicine

## 2019-10-07 DIAGNOSIS — Z20822 Contact with and (suspected) exposure to covid-19: Secondary | ICD-10-CM

## 2019-10-08 LAB — NOVEL CORONAVIRUS, NAA: SARS-CoV-2, NAA: DETECTED — AB

## 2019-10-10 ENCOUNTER — Encounter: Payer: Self-pay | Admitting: *Deleted

## 2019-10-12 ENCOUNTER — Other Ambulatory Visit: Payer: 59

## 2021-03-07 ENCOUNTER — Other Ambulatory Visit: Payer: Self-pay

## 2021-03-07 ENCOUNTER — Ambulatory Visit: Payer: 59 | Admitting: Family Medicine

## 2021-03-07 ENCOUNTER — Encounter: Payer: Self-pay | Admitting: Family Medicine

## 2021-03-07 VITALS — BP 120/78 | HR 92 | Temp 97.3°F | Ht 69.25 in | Wt 209.8 lb

## 2021-03-07 DIAGNOSIS — M67431 Ganglion, right wrist: Secondary | ICD-10-CM

## 2021-03-07 DIAGNOSIS — Z1211 Encounter for screening for malignant neoplasm of colon: Secondary | ICD-10-CM | POA: Diagnosis not present

## 2021-03-07 DIAGNOSIS — J301 Allergic rhinitis due to pollen: Secondary | ICD-10-CM | POA: Diagnosis not present

## 2021-03-07 DIAGNOSIS — R0683 Snoring: Secondary | ICD-10-CM

## 2021-03-07 DIAGNOSIS — Z23 Encounter for immunization: Secondary | ICD-10-CM | POA: Diagnosis not present

## 2021-03-07 DIAGNOSIS — Z0000 Encounter for general adult medical examination without abnormal findings: Secondary | ICD-10-CM

## 2021-03-07 MED ORDER — MONTELUKAST SODIUM 10 MG PO TABS: 10.0000 mg | ORAL_TABLET | Freq: Every day | ORAL | 3 refills | Status: AC

## 2021-03-07 MED ORDER — FLUTICASONE PROPIONATE 50 MCG/ACT NA SUSP: 2.0000 | Freq: Every day | NASAL | 6 refills | Status: AC

## 2021-03-07 MED ORDER — LEVOCETIRIZINE DIHYDROCHLORIDE 5 MG PO TABS: 5.0000 mg | ORAL_TABLET | Freq: Every evening | ORAL | 3 refills | Status: AC

## 2021-03-07 NOTE — Progress Notes (Signed)
   Subjective:    Patient ID: Mark Diaz, male    DOB: [birth year], 45 y.o.   MRN: [id number]  HPI He is here for complete examination.  His main complaint today is snoring.  Apparently his wife has noted snoring and also apneic episodes.  He also has a lesion present on his right wrist but it does not cause him much difficulty.  He does note some tingling and discomfort in both wrist when he especially lifts stuff but goes away fairly quickly.  He does have underlying allergies and is using Xyzal and would like a refill on his Singulair and Flonase.  Apparently his insurance will pay for this.  He smokes usually less than a pack every 3 days.  His work and home life are going well..  Family and social history as well as health maintenance and immunizations was reviewed   Review of Systems  All other systems reviewed and are negative.     Objective:   Physical Exam Alert and in no distress. Tympanic membranes and canals are normal. Pharyngeal area is normal. Neck is supple without adenopathy or thyromegaly. Cardiac exam shows a regular sinus rhythm without murmurs or gallops. Lungs are clear to auscultation. Epworth scale of 12 is noted.       Assessment & Plan:  Routine general medical examination at a health care facility - Plan: CBC with Differential/Platelet, Comprehensive metabolic panel, Lipid panel, EKG 12-Lead  Ganglion cyst of dorsum of right wrist  Seasonal allergic rhinitis due to pollen - Plan: montelukast (SINGULAIR) 10 MG tablet, levocetirizine (XYZAL) 5 MG tablet  Screening for colon cancer - Plan: Cologuard  Snoring - Plan: Home sleep test, Home sleep test, Home sleep test  Immunization, viral disease - Plan: PFIZER Comirnaty(GRAY TOP)COVID-19 Vaccine  Allergic rhinitis due to pollen - Plan: fluticasone (FLONASE) 50 MCG/ACT nasal spray Discussed the possible treatment for the ganglion cyst but since he is not really having trouble with it now, no intervention is  needed. Continue to treat his allergies as he has been. Discussed OSA with him and we will wait for the results of the test.  Discussed options including CPAP and possibly Inspire or even dental appliances.  Follow-up pending results of sleep study

## 2021-03-08 LAB — COMPREHENSIVE METABOLIC PANEL
ALT: 45 IU/L — ABNORMAL HIGH (ref 0–44)
AST: 34 IU/L (ref 0–40)
Albumin/Globulin Ratio: 1.7 (ref 1.2–2.2)
Albumin: 4.7 g/dL (ref 4.0–5.0)
Alkaline Phosphatase: 102 IU/L (ref 44–121)
BUN/Creatinine Ratio: 10 (ref 9–20)
BUN: 12 mg/dL (ref 6–24)
Bilirubin Total: 0.5 mg/dL (ref 0.0–1.2)
CO2: 22 mmol/L (ref 20–29)
Calcium: 9.6 mg/dL (ref 8.7–10.2)
Chloride: 103 mmol/L (ref 96–106)
Creatinine, Ser: 1.15 mg/dL (ref 0.76–1.27)
Globulin, Total: 2.8 g/dL (ref 1.5–4.5)
Glucose: 84 mg/dL (ref 65–99)
Potassium: 4.3 mmol/L (ref 3.5–5.2)
Sodium: 137 mmol/L (ref 134–144)
Total Protein: 7.5 g/dL (ref 6.0–8.5)
eGFR: 80 mL/min/{1.73_m2} (ref >59)

## 2021-03-08 LAB — LIPID PANEL
Chol/HDL Ratio: 5.2 ratio — ABNORMAL HIGH (ref 0.0–5.0)
Cholesterol, Total: 215 mg/dL — ABNORMAL HIGH (ref 100–199)
HDL: 41 mg/dL (ref >39)
LDL Chol Calc (NIH): 145 mg/dL — ABNORMAL HIGH (ref 0–99)
Triglycerides: 159 mg/dL — ABNORMAL HIGH (ref 0–149)
VLDL Cholesterol Cal: 29 mg/dL (ref 5–40)

## 2021-03-08 LAB — CBC WITH DIFFERENTIAL/PLATELET
Basophils Absolute: 0.1 10*3/uL (ref 0.0–0.2)
Basos: 1 %
EOS (ABSOLUTE): 0.2 10*3/uL (ref 0.0–0.4)
Eos: 2 %
Hematocrit: 43.8 % (ref 37.5–51.0)
Hemoglobin: 14.9 g/dL (ref 13.0–17.7)
Immature Grans (Abs): 0 10*3/uL (ref 0.0–0.1)
Immature Granulocytes: 0 %
Lymphocytes Absolute: 4 10*3/uL — ABNORMAL HIGH (ref 0.7–3.1)
Lymphs: 52 %
MCH: 31.8 pg (ref 26.6–33.0)
MCHC: 34 g/dL (ref 31.5–35.7)
MCV: 94 fL (ref 79–97)
Monocytes Absolute: 0.5 10*3/uL (ref 0.1–0.9)
Monocytes: 6 %
Neutrophils Absolute: 3 10*3/uL (ref 1.4–7.0)
Neutrophils: 39 %
Platelets: 341 10*3/uL (ref 150–450)
RBC: 4.68 x10E6/uL (ref 4.14–5.80)
RDW: 13.8 % (ref 11.6–15.4)
WBC: 7.7 10*3/uL (ref 3.4–10.8)

## 2021-03-21 LAB — COLOGUARD: Cologuard: NEGATIVE

## 2021-04-01 NOTE — Progress Notes (Unsigned)
Pt was advised of negative cologuard results [place]

## 2022-03-11 ENCOUNTER — Encounter: Payer: 59 | Admitting: Family Medicine

## 2022-05-28 ENCOUNTER — Encounter: Payer: Self-pay | Admitting: Internal Medicine

## 2022-07-01 ENCOUNTER — Encounter: Payer: Self-pay | Admitting: Internal Medicine

## 2022-07-14 ENCOUNTER — Encounter: Payer: Self-pay | Admitting: Internal Medicine

## 2023-11-17 ENCOUNTER — Encounter: Payer: Self-pay | Admitting: Internal Medicine
